# Patient Record
Sex: Female | Born: 1935 | Race: White | Hispanic: No
Health system: Southern US, Community
[De-identification: ages and names within clinical notes are randomized; demographics above are authoritative.]

---

## 2018-06-04 ENCOUNTER — Institutional Professional Consult (permissible substitution) (HOSPITAL_COMMUNITY): Payer: Medicare Other

## 2018-06-04 ENCOUNTER — Inpatient Hospital Stay
Admission: AD | Admit: 2018-06-04 | Discharge: 2018-07-07 | Disposition: A | Discharge status: Home Health | Payer: Medicare Other | Source: Other Acute Inpatient Hospital | Attending: Internal Medicine | Admitting: Internal Medicine

## 2018-06-04 ENCOUNTER — Inpatient Hospital Stay
Admission: AD | Admit: 2018-06-04 | Payer: Self-pay | Source: Other Acute Inpatient Hospital | Admitting: Internal Medicine

## 2018-06-04 DIAGNOSIS — J9690 Respiratory failure, unspecified, unspecified whether with hypoxia or hypercapnia: Secondary | ICD-10-CM

## 2018-06-04 DIAGNOSIS — J189 Pneumonia, unspecified organism: Secondary | ICD-10-CM

## 2018-06-04 DIAGNOSIS — K651 Peritoneal abscess: Secondary | ICD-10-CM

## 2018-06-04 DIAGNOSIS — K6819 Other retroperitoneal abscess: Secondary | ICD-10-CM

## 2018-06-04 DIAGNOSIS — R112 Nausea with vomiting, unspecified: Secondary | ICD-10-CM

## 2018-06-04 MED ORDER — LORAZEPAM 0.5 MG PO TABS
.50 | ORAL_TABLET | ORAL | Status: DC
Start: ? — End: 2018-06-04

## 2018-06-04 MED ORDER — CHOLECALCIFEROL 25 MCG (1000 UT) PO TABS
2000.00 | ORAL_TABLET | ORAL | Status: DC
Start: 2018-06-05 — End: 2018-06-04

## 2018-06-04 MED ORDER — FLECAINIDE ACETATE 50 MG PO TABS
50.00 | ORAL_TABLET | ORAL | Status: DC
Start: 2018-06-04 — End: 2018-06-04

## 2018-06-04 MED ORDER — MAGNESIUM OXIDE 400 MG PO TABS
400.00 | ORAL_TABLET | ORAL | Status: DC
Start: 2018-06-04 — End: 2018-06-04

## 2018-06-04 MED ORDER — GENERIC EXTERNAL MEDICATION
100.00 | Status: DC
Start: 2018-06-04 — End: 2018-06-04

## 2018-06-04 MED ORDER — LEVOTHYROXINE SODIUM 75 MCG PO TABS
75.00 | ORAL_TABLET | ORAL | Status: DC
Start: 2018-06-05 — End: 2018-06-04

## 2018-06-04 MED ORDER — OCTREOTIDE ACETATE 100 MCG/ML IJ SOLN
50.00 | INTRAMUSCULAR | Status: DC
Start: 2018-06-04 — End: 2018-06-04

## 2018-06-04 MED ORDER — DILTIAZEM HCL ER COATED BEADS 120 MG PO CP24
240.00 | ORAL_CAPSULE | ORAL | Status: DC
Start: 2018-06-05 — End: 2018-06-04

## 2018-06-04 MED ORDER — FLUTICASONE PROPIONATE 50 MCG/ACT NA SUSP
2.00 | NASAL | Status: DC
Start: 2018-06-04 — End: 2018-06-04

## 2018-06-04 MED ORDER — LORAZEPAM 0.5 MG PO TABS
0.50 | ORAL_TABLET | ORAL | Status: DC
Start: ? — End: 2018-06-04

## 2018-06-04 MED ORDER — POTASSIUM CHLORIDE ER 10 MEQ PO CPCR
20.00 | ORAL_CAPSULE | ORAL | Status: DC
Start: 2018-06-05 — End: 2018-06-04

## 2018-06-04 MED ORDER — FUROSEMIDE 20 MG PO TABS
20.00 | ORAL_TABLET | ORAL | Status: DC
Start: 2018-06-05 — End: 2018-06-04

## 2018-06-04 MED ORDER — GENERIC EXTERNAL MEDICATION
Status: DC
Start: ? — End: 2018-06-04

## 2018-06-04 MED ORDER — LISINOPRIL 5 MG PO TABS
5.00 | ORAL_TABLET | ORAL | Status: DC
Start: 2018-06-05 — End: 2018-06-04

## 2018-06-04 MED ORDER — BENZONATATE 100 MG PO CAPS
100.00 | ORAL_CAPSULE | ORAL | Status: DC
Start: ? — End: 2018-06-04

## 2018-06-04 MED ORDER — ENOXAPARIN SODIUM 40 MG/0.4ML ~~LOC~~ SOLN
40.00 | SUBCUTANEOUS | Status: DC
Start: 2018-06-04 — End: 2018-06-04

## 2018-06-04 MED ORDER — GENERIC EXTERNAL MEDICATION
2.00 | Status: DC
Start: 2018-06-04 — End: 2018-06-04

## 2018-06-04 MED ORDER — ACETAMINOPHEN 325 MG PO TABS
650.00 | ORAL_TABLET | ORAL | Status: DC
Start: ? — End: 2018-06-04

## 2018-06-04 MED ORDER — PANTOPRAZOLE SODIUM 40 MG PO TBEC
40.00 | DELAYED_RELEASE_TABLET | ORAL | Status: DC
Start: 2018-06-05 — End: 2018-06-04

## 2018-06-04 MED ORDER — FERROUS SULFATE 325 (65 FE) MG PO TABS
325.00 | ORAL_TABLET | ORAL | Status: DC
Start: 2018-06-05 — End: 2018-06-04

## 2018-06-04 MED ORDER — GENERIC EXTERNAL MEDICATION
300.00 | Status: DC
Start: 2018-06-04 — End: 2018-06-04

## 2018-06-04 MED ORDER — GUAIFENESIN 100 MG/5ML PO SYRP
200.00 | ORAL_SOLUTION | ORAL | Status: DC
Start: ? — End: 2018-06-04

## 2018-06-04 MED ORDER — GENERIC EXTERNAL MEDICATION
23.00 | Status: DC
Start: 2018-06-05 — End: 2018-06-04

## 2018-06-04 MED ORDER — DULOXETINE HCL 30 MG PO CPEP
30.00 | ORAL_CAPSULE | ORAL | Status: DC
Start: 2018-06-05 — End: 2018-06-04

## 2018-06-04 MED ORDER — GENERIC EXTERNAL MEDICATION
5.00 | Status: DC
Start: ? — End: 2018-06-04

## 2018-06-04 MED ORDER — ALBUTEROL SULFATE (2.5 MG/3ML) 0.083% IN NEBU
2.50 | INHALATION_SOLUTION | RESPIRATORY_TRACT | Status: DC
Start: ? — End: 2018-06-04

## 2018-06-05 LAB — CBC WITH DIFFERENTIAL/PLATELET
Abs Immature Granulocytes: 0.1 10*3/uL (ref 0.0–0.1)
Basophils Absolute: 0.1 10*3/uL (ref 0.0–0.1)
Basophils Relative: 1 %
EOS ABS: 0.3 10*3/uL (ref 0.0–0.7)
Eosinophils Relative: 3 %
HEMATOCRIT: 32.8 % — AB (ref 36.0–46.0)
Hemoglobin: 9.9 g/dL — ABNORMAL LOW (ref 12.0–15.0)
IMMATURE GRANULOCYTES: 1 %
LYMPHS ABS: 2.7 10*3/uL (ref 0.7–4.0)
Lymphocytes Relative: 26 %
MCH: 28.4 pg (ref 26.0–34.0)
MCHC: 30.2 g/dL (ref 30.0–36.0)
MCV: 94.3 fL (ref 78.0–100.0)
MONO ABS: 1.2 10*3/uL — AB (ref 0.1–1.0)
MONOS PCT: 11 %
NEUTROS PCT: 58 %
Neutro Abs: 6.1 10*3/uL (ref 1.7–7.7)
Platelets: 486 10*3/uL — ABNORMAL HIGH (ref 150–400)
RBC: 3.48 MIL/uL — ABNORMAL LOW (ref 3.87–5.11)
RDW: 15.3 % (ref 11.5–15.5)
WBC: 10.6 10*3/uL — ABNORMAL HIGH (ref 4.0–10.5)

## 2018-06-05 LAB — URINALYSIS, ROUTINE W REFLEX MICROSCOPIC
BILIRUBIN URINE: NEGATIVE
Glucose, UA: NEGATIVE mg/dL
KETONES UR: NEGATIVE mg/dL
Leukocytes, UA: NEGATIVE
Nitrite: NEGATIVE
PH: 5 (ref 5.0–8.0)
Protein, ur: 30 mg/dL — AB
Specific Gravity, Urine: 1.01 (ref 1.005–1.030)

## 2018-06-05 LAB — COMPREHENSIVE METABOLIC PANEL
ALBUMIN: 2.3 g/dL — AB (ref 3.5–5.0)
ALK PHOS: 86 U/L (ref 38–126)
ALT: 11 U/L (ref 0–44)
ANION GAP: 10 (ref 5–15)
AST: 24 U/L (ref 15–41)
BILIRUBIN TOTAL: 1.1 mg/dL (ref 0.3–1.2)
BUN: 51 mg/dL — ABNORMAL HIGH (ref 8–23)
CALCIUM: 10.4 mg/dL — AB (ref 8.9–10.3)
CO2: 25 mmol/L (ref 22–32)
Chloride: 100 mmol/L (ref 98–111)
Creatinine, Ser: 1.33 mg/dL — ABNORMAL HIGH (ref 0.44–1.00)
GFR calc Af Amer: 41 mL/min — ABNORMAL LOW (ref >60)
GFR, EST NON AFRICAN AMERICAN: 35 mL/min — AB (ref >60)
GLUCOSE: 137 mg/dL — AB (ref 70–99)
POTASSIUM: 4.8 mmol/L (ref 3.5–5.1)
Sodium: 135 mmol/L (ref 135–145)
TOTAL PROTEIN: 7 g/dL (ref 6.5–8.1)

## 2018-06-05 LAB — PROTIME-INR
INR: 1.14
Prothrombin Time: 14.5 seconds (ref 11.4–15.2)

## 2018-06-05 LAB — HEMOGLOBIN A1C
Hgb A1c MFr Bld: 6.1 % — ABNORMAL HIGH (ref 4.8–5.6)
MEAN PLASMA GLUCOSE: 128.37 mg/dL

## 2018-06-05 LAB — TSH: TSH: 1.889 u[IU]/mL (ref 0.350–4.500)

## 2018-06-05 LAB — PHOSPHORUS: Phosphorus: 5.6 mg/dL — ABNORMAL HIGH (ref 2.5–4.6)

## 2018-06-05 LAB — MAGNESIUM: MAGNESIUM: 2.3 mg/dL (ref 1.7–2.4)

## 2018-06-07 LAB — C DIFFICILE QUICK SCREEN W PCR REFLEX
C DIFFICILE (CDIFF) INTERP: NOT DETECTED
C DIFFICLE (CDIFF) ANTIGEN: NEGATIVE
C Diff toxin: NEGATIVE

## 2018-06-08 ENCOUNTER — Other Ambulatory Visit (HOSPITAL_COMMUNITY): Payer: Medicare Other

## 2018-06-09 LAB — CULTURE, BLOOD (ROUTINE X 2)
Culture: NO GROWTH
Culture: NO GROWTH
SPECIAL REQUESTS: ADEQUATE
SPECIAL REQUESTS: ADEQUATE

## 2018-06-10 LAB — CBC
HEMATOCRIT: 31.4 % — AB (ref 36.0–46.0)
HEMOGLOBIN: 9.7 g/dL — AB (ref 12.0–15.0)
MCH: 28.5 pg (ref 26.0–34.0)
MCHC: 30.9 g/dL (ref 30.0–36.0)
MCV: 92.4 fL (ref 78.0–100.0)
Platelets: 435 10*3/uL — ABNORMAL HIGH (ref 150–400)
RBC: 3.4 MIL/uL — AB (ref 3.87–5.11)
RDW: 15 % (ref 11.5–15.5)
WBC: 9.1 10*3/uL (ref 4.0–10.5)

## 2018-06-10 LAB — BASIC METABOLIC PANEL
ANION GAP: 8 (ref 5–15)
BUN: 47 mg/dL — ABNORMAL HIGH (ref 8–23)
CHLORIDE: 98 mmol/L (ref 98–111)
CO2: 24 mmol/L (ref 22–32)
Calcium: 10.4 mg/dL — ABNORMAL HIGH (ref 8.9–10.3)
Creatinine, Ser: 1.3 mg/dL — ABNORMAL HIGH (ref 0.44–1.00)
GFR calc non Af Amer: 36 mL/min — ABNORMAL LOW (ref >60)
GFR, EST AFRICAN AMERICAN: 42 mL/min — AB (ref >60)
Glucose, Bld: 159 mg/dL — ABNORMAL HIGH (ref 70–99)
POTASSIUM: 4.9 mmol/L (ref 3.5–5.1)
SODIUM: 130 mmol/L — AB (ref 135–145)

## 2018-06-13 LAB — TSH: TSH: 3.056 u[IU]/mL (ref 0.350–4.500)

## 2018-06-14 LAB — BASIC METABOLIC PANEL
Anion gap: 3 — ABNORMAL LOW (ref 5–15)
BUN: 52 mg/dL — ABNORMAL HIGH (ref 8–23)
CALCIUM: 10.2 mg/dL (ref 8.9–10.3)
CO2: 25 mmol/L (ref 22–32)
CREATININE: 1.31 mg/dL — AB (ref 0.44–1.00)
Chloride: 99 mmol/L (ref 98–111)
GFR, EST AFRICAN AMERICAN: 41 mL/min — AB (ref >60)
GFR, EST NON AFRICAN AMERICAN: 36 mL/min — AB (ref >60)
GLUCOSE: 152 mg/dL — AB (ref 70–99)
Potassium: 5.9 mmol/L — ABNORMAL HIGH (ref 3.5–5.1)
Sodium: 127 mmol/L — ABNORMAL LOW (ref 135–145)

## 2018-06-14 LAB — POTASSIUM: Potassium: 5.1 mmol/L (ref 3.5–5.1)

## 2018-06-15 LAB — BASIC METABOLIC PANEL
Anion gap: 7 (ref 5–15)
BUN: 46 mg/dL — AB (ref 8–23)
CO2: 26 mmol/L (ref 22–32)
CREATININE: 1.41 mg/dL — AB (ref 0.44–1.00)
Calcium: 10.5 mg/dL — ABNORMAL HIGH (ref 8.9–10.3)
Chloride: 99 mmol/L (ref 98–111)
GFR calc Af Amer: 38 mL/min — ABNORMAL LOW (ref >60)
GFR calc non Af Amer: 33 mL/min — ABNORMAL LOW (ref >60)
GLUCOSE: 123 mg/dL — AB (ref 70–99)
Potassium: 6.2 mmol/L — ABNORMAL HIGH (ref 3.5–5.1)
Sodium: 132 mmol/L — ABNORMAL LOW (ref 135–145)

## 2018-06-15 LAB — POTASSIUM: Potassium: 4.9 mmol/L (ref 3.5–5.1)

## 2018-06-16 ENCOUNTER — Other Ambulatory Visit (HOSPITAL_COMMUNITY): Payer: Medicare Other

## 2018-06-16 LAB — BASIC METABOLIC PANEL
ANION GAP: 7 (ref 5–15)
BUN: 35 mg/dL — ABNORMAL HIGH (ref 8–23)
CO2: 29 mmol/L (ref 22–32)
CREATININE: 1.32 mg/dL — AB (ref 0.44–1.00)
Calcium: 9.8 mg/dL (ref 8.9–10.3)
Chloride: 97 mmol/L — ABNORMAL LOW (ref 98–111)
GFR, EST AFRICAN AMERICAN: 41 mL/min — AB (ref >60)
GFR, EST NON AFRICAN AMERICAN: 35 mL/min — AB (ref >60)
Glucose, Bld: 116 mg/dL — ABNORMAL HIGH (ref 70–99)
Potassium: 4.3 mmol/L (ref 3.5–5.1)
SODIUM: 133 mmol/L — AB (ref 135–145)

## 2018-06-16 MED ORDER — IOHEXOL 300 MG/ML  SOLN
80.0000 mL | Freq: Once | INTRAMUSCULAR | Status: AC | PRN
  Administered 2018-06-16: 100 mL via INTRAVENOUS

## 2018-06-18 LAB — BASIC METABOLIC PANEL
Anion gap: 10 (ref 5–15)
BUN: 18 mg/dL (ref 8–23)
CHLORIDE: 95 mmol/L — AB (ref 98–111)
CO2: 26 mmol/L (ref 22–32)
Calcium: 9.1 mg/dL (ref 8.9–10.3)
Creatinine, Ser: 1.18 mg/dL — ABNORMAL HIGH (ref 0.44–1.00)
GFR calc Af Amer: 47 mL/min — ABNORMAL LOW (ref >60)
GFR calc non Af Amer: 41 mL/min — ABNORMAL LOW (ref >60)
GLUCOSE: 110 mg/dL — AB (ref 70–99)
POTASSIUM: 4.8 mmol/L (ref 3.5–5.1)
Sodium: 131 mmol/L — ABNORMAL LOW (ref 135–145)

## 2018-06-20 ENCOUNTER — Telehealth: Payer: Self-pay | Admitting: Physician Assistant

## 2018-06-20 NOTE — Telephone Encounter (Signed)
Spoke with patient's daughter/HCPOA Nikea Settle via phone [phone]) at her request. Ms. Colberg was concerned regarding request for drain injection stating that she was not made aware of this request and did not consent for this to be done. She states she was told by [facility] staff that a provider came into her mother's room and attempted to gain consent from her for a drain injection which caused her mother to be very upset since yesterday afternoon. She states that no one is to touch her mother without conversing with her first and all lab/study results should be discussed with her immediately. Additionally she is very upset that anything regarding her mother's healthcare was discussed with her in any capacity due to her dementia - she requests that no mention of any future plans be made to her mother as these are upsetting to her. She requests all information be relayed to [facility] only.  I discussed with Ms. Amstutz that I did see patient yesterday to evaluate transgluteal drain however I did NOT attempt to gain consent from patient or any other person on the unit as drain injections/removals for drains placed by our team do not require consent per our policy. I also explained that during the drain evaluation at bedside, which consisted of viewing the gravity bag and drain insertion site only, her mother repeatedly asked what I was doing and why to which I stated to the patient "I am here to look at your drain and see if we can possibly take it out later." Nothing else was discussed with patient and again, consent was not attempted to be obtained. I did speak with sitter in room briefly stating that a drain injection was requested by the patient's primary care team and that we would be bringing her to IR for this injection some time this week to see if the drain could possible be removed to which the sitter nodded her head and did not ask any further questions or express any concerns. I apologized several  times to Ms. Evangaline Jou for upsetting her mother as this was not my intention. I again discussed that drain injections are not usually something we need consent for however I was happy to discuss the injection with her today which we did - she indicated that all questions were answered to her satisfaction although she remains upset overall.   Ms. Cotrell does give permission for the drain injection to proceed however she would like someone to call when the injection is to occur and to discuss drain removal (if that is indicated after injection) prior to it being removed. She also requests that the results of the drain injection be relayed to her immediately, preferably by the radiologist or radiology PA, as she expresses difficult communication with her mother's primary care team as well as floor RNs.   I have relayed all of this information to the patient's primary RN as well as the IR team.   Plan to proceed with drain injection to evaluate fistula/abscess possibly tomorrow or the following day which was discussed with patient's daughter.    Lynnette Caffey, PA-C 06/20/18 11:16

## 2018-06-21 ENCOUNTER — Other Ambulatory Visit (HOSPITAL_COMMUNITY): Payer: Medicare Other

## 2018-06-21 ENCOUNTER — Encounter (HOSPITAL_COMMUNITY): Payer: Self-pay | Admitting: Interventional Radiology

## 2018-06-21 HISTORY — PX: IR SINUS/FIST TUBE CHK-NON GI: IMG673

## 2018-06-21 MED ORDER — IOPAMIDOL (ISOVUE-300) INJECTION 61%
INTRAVENOUS | Status: AC
  Filled 2018-06-21: qty 50

## 2018-06-21 MED ORDER — LIDOCAINE HCL 1 % IJ SOLN
INTRAMUSCULAR | Status: AC
  Filled 2018-06-21: qty 20

## 2018-06-21 MED ORDER — IOPAMIDOL (ISOVUE-300) INJECTION 61%
INTRAVENOUS | Status: AC
  Administered 2018-06-21: 10 mL
  Filled 2018-06-21: qty 50

## 2018-06-21 NOTE — Progress Notes (Signed)
Spoke with Mrs. Espy daughter to inform her that we would be bringing her mother down to IR for drain check and possible removal this morning. Daughter states it is ok to inject drain and remove if indicated. Daughter requests to be called when procedure done with results.

## 2018-06-21 NOTE — Procedures (Signed)
Pelvic abscess drain injection  Findings - Positive for fistula to small bowel  Plan - Continue drain. Follow up injection 2-4 weeks.  EBL 0 Comp 0

## 2018-06-22 ENCOUNTER — Other Ambulatory Visit (HOSPITAL_COMMUNITY): Payer: Medicare Other

## 2018-06-22 LAB — BASIC METABOLIC PANEL
Anion gap: 9 (ref 5–15)
BUN: 26 mg/dL — AB (ref 8–23)
CALCIUM: 9.1 mg/dL (ref 8.9–10.3)
CO2: 27 mmol/L (ref 22–32)
Chloride: 96 mmol/L — ABNORMAL LOW (ref 98–111)
Creatinine, Ser: 1.8 mg/dL — ABNORMAL HIGH (ref 0.44–1.00)
GFR calc Af Amer: 28 mL/min — ABNORMAL LOW (ref >60)
GFR, EST NON AFRICAN AMERICAN: 24 mL/min — AB (ref >60)
Glucose, Bld: 139 mg/dL — ABNORMAL HIGH (ref 70–99)
POTASSIUM: 3.9 mmol/L (ref 3.5–5.1)
SODIUM: 132 mmol/L — AB (ref 135–145)

## 2018-06-22 LAB — URINALYSIS, ROUTINE W REFLEX MICROSCOPIC
Bacteria, UA: NONE SEEN
Bilirubin Urine: NEGATIVE
Glucose, UA: NEGATIVE mg/dL
Ketones, ur: NEGATIVE mg/dL
Nitrite: NEGATIVE
PH: 6 (ref 5.0–8.0)
Protein, ur: >=300 mg/dL — AB
Specific Gravity, Urine: 1.013 (ref 1.005–1.030)

## 2018-06-23 LAB — BASIC METABOLIC PANEL
Anion gap: 7 (ref 5–15)
BUN: 35 mg/dL — ABNORMAL HIGH (ref 8–23)
CHLORIDE: 96 mmol/L — AB (ref 98–111)
CO2: 29 mmol/L (ref 22–32)
CREATININE: 1.41 mg/dL — AB (ref 0.44–1.00)
Calcium: 8.9 mg/dL (ref 8.9–10.3)
GFR calc Af Amer: 38 mL/min — ABNORMAL LOW (ref >60)
GFR calc non Af Amer: 33 mL/min — ABNORMAL LOW (ref >60)
Glucose, Bld: 142 mg/dL — ABNORMAL HIGH (ref 70–99)
Potassium: 4.1 mmol/L (ref 3.5–5.1)
Sodium: 132 mmol/L — ABNORMAL LOW (ref 135–145)

## 2018-06-23 LAB — CBC
HEMATOCRIT: 29 % — AB (ref 36.0–46.0)
Hemoglobin: 9 g/dL — ABNORMAL LOW (ref 12.0–15.0)
MCH: 28.9 pg (ref 26.0–34.0)
MCHC: 31 g/dL (ref 30.0–36.0)
MCV: 93.2 fL (ref 80.0–100.0)
Platelets: 257 10*3/uL (ref 150–400)
RBC: 3.11 MIL/uL — ABNORMAL LOW (ref 3.87–5.11)
RDW: 16 % — AB (ref 11.5–15.5)
WBC: 11.4 10*3/uL — ABNORMAL HIGH (ref 4.0–10.5)
nRBC: 0 % (ref 0.0–0.2)

## 2018-06-23 LAB — URINE CULTURE: Culture: NO GROWTH

## 2018-06-24 ENCOUNTER — Other Ambulatory Visit (HOSPITAL_COMMUNITY): Payer: Medicare Other

## 2018-06-24 LAB — BASIC METABOLIC PANEL
Anion gap: 6 (ref 5–15)
BUN: 39 mg/dL — ABNORMAL HIGH (ref 8–23)
CO2: 27 mmol/L (ref 22–32)
CREATININE: 1.26 mg/dL — AB (ref 0.44–1.00)
Calcium: 8.9 mg/dL (ref 8.9–10.3)
Chloride: 96 mmol/L — ABNORMAL LOW (ref 98–111)
GFR calc Af Amer: 43 mL/min — ABNORMAL LOW (ref >60)
GFR, EST NON AFRICAN AMERICAN: 37 mL/min — AB (ref >60)
Glucose, Bld: 146 mg/dL — ABNORMAL HIGH (ref 70–99)
Potassium: 4.5 mmol/L (ref 3.5–5.1)
SODIUM: 129 mmol/L — AB (ref 135–145)

## 2018-06-24 LAB — AMMONIA: AMMONIA: 32 umol/L (ref 9–35)

## 2018-06-25 LAB — BASIC METABOLIC PANEL
ANION GAP: 7 (ref 5–15)
BUN: 35 mg/dL — AB (ref 8–23)
CO2: 24 mmol/L (ref 22–32)
Calcium: 8.6 mg/dL — ABNORMAL LOW (ref 8.9–10.3)
Chloride: 98 mmol/L (ref 98–111)
Creatinine, Ser: 1.09 mg/dL — ABNORMAL HIGH (ref 0.44–1.00)
GFR calc Af Amer: 52 mL/min — ABNORMAL LOW (ref >60)
GFR calc non Af Amer: 45 mL/min — ABNORMAL LOW (ref >60)
GLUCOSE: 135 mg/dL — AB (ref 70–99)
POTASSIUM: 4.3 mmol/L (ref 3.5–5.1)
Sodium: 129 mmol/L — ABNORMAL LOW (ref 135–145)

## 2018-06-26 LAB — BASIC METABOLIC PANEL
ANION GAP: 8 (ref 5–15)
BUN: 37 mg/dL — ABNORMAL HIGH (ref 8–23)
CALCIUM: 8.6 mg/dL — AB (ref 8.9–10.3)
CO2: 25 mmol/L (ref 22–32)
CREATININE: 1.12 mg/dL — AB (ref 0.44–1.00)
Chloride: 97 mmol/L — ABNORMAL LOW (ref 98–111)
GFR calc Af Amer: 50 mL/min — ABNORMAL LOW (ref >60)
GFR calc non Af Amer: 43 mL/min — ABNORMAL LOW (ref >60)
GLUCOSE: 147 mg/dL — AB (ref 70–99)
Potassium: 4.7 mmol/L (ref 3.5–5.1)
Sodium: 130 mmol/L — ABNORMAL LOW (ref 135–145)

## 2018-06-27 ENCOUNTER — Other Ambulatory Visit (HOSPITAL_COMMUNITY): Payer: Medicare Other

## 2018-06-27 LAB — CBC WITH DIFFERENTIAL/PLATELET
Abs Immature Granulocytes: 0.17 10*3/uL — ABNORMAL HIGH (ref 0.00–0.07)
BASOS PCT: 0 %
Basophils Absolute: 0 10*3/uL (ref 0.0–0.1)
EOS ABS: 0.1 10*3/uL (ref 0.0–0.5)
Eosinophils Relative: 1 %
HCT: 26.1 % — ABNORMAL LOW (ref 36.0–46.0)
Hemoglobin: 8 g/dL — ABNORMAL LOW (ref 12.0–15.0)
IMMATURE GRANULOCYTES: 1 %
Lymphocytes Relative: 4 %
Lymphs Abs: 0.8 10*3/uL (ref 0.7–4.0)
MCH: 27.9 pg (ref 26.0–34.0)
MCHC: 30.7 g/dL (ref 30.0–36.0)
MCV: 90.9 fL (ref 80.0–100.0)
MONO ABS: 1.8 10*3/uL — AB (ref 0.1–1.0)
Monocytes Relative: 8 %
NEUTROS PCT: 86 %
Neutro Abs: 18.5 10*3/uL — ABNORMAL HIGH (ref 1.7–7.7)
PLATELETS: 287 10*3/uL (ref 150–400)
RBC: 2.87 MIL/uL — ABNORMAL LOW (ref 3.87–5.11)
RDW: 15.5 % (ref 11.5–15.5)
WBC: 21.4 10*3/uL — ABNORMAL HIGH (ref 4.0–10.5)
nRBC: 0 % (ref 0.0–0.2)

## 2018-06-27 LAB — CULTURE, BLOOD (ROUTINE X 2)
CULTURE: NO GROWTH
Culture: NO GROWTH
SPECIAL REQUESTS: ADEQUATE
SPECIAL REQUESTS: ADEQUATE

## 2018-06-28 LAB — BASIC METABOLIC PANEL
ANION GAP: 9 (ref 5–15)
BUN: 46 mg/dL — ABNORMAL HIGH (ref 8–23)
CHLORIDE: 96 mmol/L — AB (ref 98–111)
CO2: 23 mmol/L (ref 22–32)
Calcium: 8.7 mg/dL — ABNORMAL LOW (ref 8.9–10.3)
Creatinine, Ser: 1.3 mg/dL — ABNORMAL HIGH (ref 0.44–1.00)
GFR calc Af Amer: 42 mL/min — ABNORMAL LOW (ref >60)
GFR calc non Af Amer: 36 mL/min — ABNORMAL LOW (ref >60)
Glucose, Bld: 140 mg/dL — ABNORMAL HIGH (ref 70–99)
POTASSIUM: 5.4 mmol/L — AB (ref 3.5–5.1)
Sodium: 128 mmol/L — ABNORMAL LOW (ref 135–145)

## 2018-06-28 LAB — EXPECTORATED SPUTUM ASSESSMENT W GRAM STAIN, RFLX TO RESP C

## 2018-06-29 ENCOUNTER — Other Ambulatory Visit (HOSPITAL_COMMUNITY): Payer: Medicare Other

## 2018-06-29 LAB — CBC
HCT: 24.6 % — ABNORMAL LOW (ref 36.0–46.0)
HEMOGLOBIN: 7.6 g/dL — AB (ref 12.0–15.0)
MCH: 27.8 pg (ref 26.0–34.0)
MCHC: 30.9 g/dL (ref 30.0–36.0)
MCV: 90.1 fL (ref 80.0–100.0)
NRBC: 0 % (ref 0.0–0.2)
PLATELETS: 290 10*3/uL (ref 150–400)
RBC: 2.73 MIL/uL — AB (ref 3.87–5.11)
RDW: 15.9 % — ABNORMAL HIGH (ref 11.5–15.5)
WBC: 23 10*3/uL — AB (ref 4.0–10.5)

## 2018-06-29 LAB — BASIC METABOLIC PANEL
ANION GAP: 9 (ref 5–15)
BUN: 44 mg/dL — AB (ref 8–23)
CHLORIDE: 99 mmol/L (ref 98–111)
CO2: 21 mmol/L — ABNORMAL LOW (ref 22–32)
Calcium: 8.4 mg/dL — ABNORMAL LOW (ref 8.9–10.3)
Creatinine, Ser: 1.22 mg/dL — ABNORMAL HIGH (ref 0.44–1.00)
GFR calc non Af Amer: 39 mL/min — ABNORMAL LOW (ref >60)
GFR, EST AFRICAN AMERICAN: 45 mL/min — AB (ref >60)
Glucose, Bld: 174 mg/dL — ABNORMAL HIGH (ref 70–99)
POTASSIUM: 4.9 mmol/L (ref 3.5–5.1)
SODIUM: 129 mmol/L — AB (ref 135–145)

## 2018-06-29 NOTE — Progress Notes (Signed)
    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 Grace Isaac.  Recent injection shows persistent fistula.  Subjective:  Patient resting comfortably. Daughter at bedside.  Allergies: Patient has no allergy information on record.  Medications: Prior to Admission medications   Not on File    Physical Exam Resting  Drain in place Drainage has some blood ting.  Imaging: Dg Chest Port 1 View  Result Date: 06/29/2018 CLINICAL DATA:  Respiratory failure EXAM: PORTABLE CHEST 1 VIEW COMPARISON:  06/27/2018 FINDINGS: Right PICC line remains in place, unchanged. Cardiomegaly. Vascular congestion. Focal left lower lobe airspace opacity has worsened. Probable left effusion. Findings concerning for pneumonia. Partially visualized left humeral shaft fracture again noted, unchanged. IMPRESSION: Cardiomegaly with vascular congestion. Worsening left lower lobe airspace opacity concerning for pneumonia. Probable small left effusion. Electronically Signed   By: Charlett Nose M.D.   On: 06/29/2018 12:17   Dg Chest Port 1 View  Result Date: 06/27/2018 CLINICAL DATA:  Pneumonia Right picc present EXAM: PORTABLE CHEST - 1 VIEW COMPARISON:  06/22/2018 FINDINGS: Some increase in patchy airspace opacities in the left mid and lower lung. Right perihilar interstitial opacities as before. Heart size upper limits normal for technique. Aortic Atherosclerosis (ICD10-170.0). No effusion.  Right arm PICC to the distal SVC stable. Fracture deformities of the right humeral head and proximal left humerus. IMPRESSION: Worsening left lower lung airspace disease suggesting pneumonia. Electronically Signed   By: Corlis Leak M.D.   On: 06/27/2018 10:32    Labs:  CBC: Recent Labs    06/10/18 0630 06/23/18 0647  06/27/18 1345 06/29/18 0720  WBC 9.1 11.4* 21.4* 23.0*  HGB 9.7* 9.0* 8.0* 7.6*  HCT 31.4* 29.0* 26.1* 24.6*  PLT 435* 257 287 290    COAGS: Recent Labs    06/05/18 0653  INR 1.14    BMP: Recent Labs    06/25/18 0514 06/26/18 0552 06/28/18 0715 06/29/18 0720  NA 129* 130* 128* 129*  K 4.3 4.7 5.4* 4.9  CL 98 97* 96* 99  CO2 24 25 23  21*  GLUCOSE 135* 147* 140* 174*  BUN 35* 37* 46* 44*  CALCIUM 8.6* 8.6* 8.7* 8.4*  CREATININE 1.09* 1.12* 1.30* 1.22*  GFRNONAA 45* 43* 36* 39*  GFRAA 52* 50* 42* 45*    LIVER FUNCTION TESTS: Recent Labs    06/05/18 0653  BILITOT 1.1  AST 24  ALT 11  ALKPHOS 86  PROT 7.0  ALBUMIN 2.3*    Assessment and Plan:  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 Grace Isaac.  Fistulous connection remaining on recent CT scan.  Nurses are NOT flushing as directed.  Will consider repeating drain injection next week.  Electronically Signed: Gwynneth Macleod, PA-C 06/29/2018, 2:13 PM    I spent a total of 15 Minutes at the the patient's bedside AND on the patient's hospital floor or unit, greater than 50% of which was counseling/coordinating care for

## 2018-06-30 LAB — BASIC METABOLIC PANEL
ANION GAP: 12 (ref 5–15)
BUN: 47 mg/dL — ABNORMAL HIGH (ref 8–23)
CALCIUM: 8.9 mg/dL (ref 8.9–10.3)
CO2: 19 mmol/L — ABNORMAL LOW (ref 22–32)
Chloride: 98 mmol/L (ref 98–111)
Creatinine, Ser: 1.35 mg/dL — ABNORMAL HIGH (ref 0.44–1.00)
GFR, EST AFRICAN AMERICAN: 40 mL/min — AB (ref >60)
GFR, EST NON AFRICAN AMERICAN: 34 mL/min — AB (ref >60)
GLUCOSE: 182 mg/dL — AB (ref 70–99)
Potassium: 4.5 mmol/L (ref 3.5–5.1)
SODIUM: 129 mmol/L — AB (ref 135–145)

## 2018-07-02 LAB — BASIC METABOLIC PANEL
ANION GAP: 8 (ref 5–15)
BUN: 67 mg/dL — ABNORMAL HIGH (ref 8–23)
CALCIUM: 8.7 mg/dL — AB (ref 8.9–10.3)
CO2: 25 mmol/L (ref 22–32)
Chloride: 101 mmol/L (ref 98–111)
Creatinine, Ser: 1.29 mg/dL — ABNORMAL HIGH (ref 0.44–1.00)
GFR calc non Af Amer: 36 mL/min — ABNORMAL LOW (ref >60)
GFR, EST AFRICAN AMERICAN: 42 mL/min — AB (ref >60)
Glucose, Bld: 216 mg/dL — ABNORMAL HIGH (ref 70–99)
POTASSIUM: 3.7 mmol/L (ref 3.5–5.1)
Sodium: 134 mmol/L — ABNORMAL LOW (ref 135–145)

## 2018-07-03 LAB — VANCOMYCIN, TROUGH: Vancomycin Tr: 13 ug/mL — ABNORMAL LOW (ref 15–20)

## 2018-07-04 LAB — BASIC METABOLIC PANEL
Anion gap: 10 (ref 5–15)
BUN: 60 mg/dL — ABNORMAL HIGH (ref 8–23)
CHLORIDE: 100 mmol/L (ref 98–111)
CO2: 22 mmol/L (ref 22–32)
Calcium: 8.8 mg/dL — ABNORMAL LOW (ref 8.9–10.3)
Creatinine, Ser: 1.07 mg/dL — ABNORMAL HIGH (ref 0.44–1.00)
GFR calc Af Amer: 53 mL/min — ABNORMAL LOW (ref >60)
GFR calc non Af Amer: 46 mL/min — ABNORMAL LOW (ref >60)
Glucose, Bld: 200 mg/dL — ABNORMAL HIGH (ref 70–99)
POTASSIUM: 3.6 mmol/L (ref 3.5–5.1)
Sodium: 132 mmol/L — ABNORMAL LOW (ref 135–145)

## 2018-07-05 ENCOUNTER — Other Ambulatory Visit (HOSPITAL_COMMUNITY): Payer: Medicare Other

## 2018-07-05 DIAGNOSIS — I713 Abdominal aortic aneurysm, ruptured: Secondary | ICD-10-CM | POA: Diagnosis not present

## 2018-07-05 LAB — CBC
HCT: 27 % — ABNORMAL LOW (ref 36.0–46.0)
Hemoglobin: 8.6 g/dL — ABNORMAL LOW (ref 12.0–15.0)
MCH: 27.7 pg (ref 26.0–34.0)
MCHC: 31.9 g/dL (ref 30.0–36.0)
MCV: 86.8 fL (ref 80.0–100.0)
PLATELETS: 528 10*3/uL — AB (ref 150–400)
RBC: 3.11 MIL/uL — ABNORMAL LOW (ref 3.87–5.11)
RDW: 16 % — AB (ref 11.5–15.5)
WBC: 26.9 10*3/uL — AB (ref 4.0–10.5)
nRBC: 0 % (ref 0.0–0.2)

## 2018-07-05 MED ORDER — IOHEXOL 300 MG/ML  SOLN
100.0000 mL | Freq: Once | INTRAMUSCULAR | Status: AC
  Administered 2018-07-05: 100 mL via INTRAVENOUS

## 2018-07-05 NOTE — Consult Note (Signed)
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 female seen for discussion of new finding on CT scan earlier today.  Her daughter is present.  She is hard of hearing and is in and out of sleep but the daughter is nurse and the main discussion is with her.  Patient has an extremely complex history and [facility].  [facility] was [facility] to [facility] with sepsis.  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  She had both fungal and bacterial infection.  Had hydronephrosis related to this as well and had renal stenting.  [facility].  She had CT earlier today for follow-up.  This had marked changes regarding her most recent CT scan on 06/16/2018.  No past medical history on file.  No family history on file.  SOCIAL HISTORY: Social History   Tobacco Use  . Smoking status: Not on file  Substance Use Topics  . Alcohol use: Not on file    Allergies not on file  No current facility-administered medications for this encounter.     REVIEW OF SYSTEMS:  Reviewed in her history and physical  PHYSICAL EXAM: There were no vitals filed for this visit.  GENERAL: The patient is a well-nourished female, in no acute distress. The vital signs are documented above. CARDIOVASCULAR: Palpable radial pulses bilaterally PULMONARY: There is good air exchange  MUSCULOSKELETAL: There are no major deformities or cyanosis. NEUROLOGIC: No focal weakness or paresthesias are detected. SKIN: There are no ulcers or rashes noted. PSYCHIATRIC: The patient has a normal affect.  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  She has very small and very atherosclerotic aorta and iliac segments below this.  MEDICAL ISSUES: I discussed this at length with the patient and her daughter present.  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  I explained that there is essentially no treatment options for her.  She obviously would not tolerate open repair of her aorta even if there was an option for this.  Also explained that she is not a candidate for stent graft repair due to the presumed septic nature of this and also the involvement of the celiac artery.  In addition there would be impossible to deliver a stent graft through her diseased aortoiliac segments.  I explained that in all likelihood she will rupture aorta and I suspect that this will be in the relatively near future.  Fortunately she is comfortable with no pain associated with this.  I explained to the daughter that this in all likelihood to be quite rapid with minimal suffering.  I suggested involving hospice and palliative care.  The daughter reports that the mother is quite resistant to discussing this.  She is questioning me whether it would be acceptable for her to be discharged home if there is adequate help.  I explained that this would certainly be her decision but I do not see any contraindication to this and that I would recommend comfort measures only.  We will see her on an as-needed basis  Larina Earthlyodd F. Joey Hudock, MD FACS [facility] Office Tel [phone] Pager [phone]

## 2018-07-06 LAB — BASIC METABOLIC PANEL
ANION GAP: 8 (ref 5–15)
BUN: 67 mg/dL — ABNORMAL HIGH (ref 8–23)
CALCIUM: 8.4 mg/dL — AB (ref 8.9–10.3)
CHLORIDE: 100 mmol/L (ref 98–111)
CO2: 25 mmol/L (ref 22–32)
CREATININE: 1.24 mg/dL — AB (ref 0.44–1.00)
GFR calc Af Amer: 44 mL/min — ABNORMAL LOW (ref >60)
GFR calc non Af Amer: 38 mL/min — ABNORMAL LOW (ref >60)
Glucose, Bld: 226 mg/dL — ABNORMAL HIGH (ref 70–99)
Potassium: 3.3 mmol/L — ABNORMAL LOW (ref 3.5–5.1)
SODIUM: 133 mmol/L — AB (ref 135–145)

## 2018-07-06 LAB — CBC
HCT: 25.7 % — ABNORMAL LOW (ref 36.0–46.0)
Hemoglobin: 7.9 g/dL — ABNORMAL LOW (ref 12.0–15.0)
MCH: 27.3 pg (ref 26.0–34.0)
MCHC: 30.7 g/dL (ref 30.0–36.0)
MCV: 88.9 fL (ref 80.0–100.0)
NRBC: 0 % (ref 0.0–0.2)
PLATELETS: 519 10*3/uL — AB (ref 150–400)
RBC: 2.89 MIL/uL — AB (ref 3.87–5.11)
RDW: 16.1 % — AB (ref 11.5–15.5)
WBC: 19.8 10*3/uL — AB (ref 4.0–10.5)

## 2018-07-06 LAB — SEDIMENTATION RATE: Sed Rate: >140 mm/hr — ABNORMAL HIGH (ref 0–22)

## 2018-07-07 NOTE — Progress Notes (Signed)
Patient ID: Edison Nasuti, female   DOB: [birth year], 84 y.o.   MRN: [id number]   Pt came to [facility] with existing drain  - Rt TG drain  IR was asked to evaluate and manage drain 10/9 Inj:  IMPRESSION: Injection of the pelvic abscess drain demonstrates a persistent abscess cavity and a persistent fistula to small bowel loops.  CT 10/23:  IMPRESSION: 1. 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 This extraluminal collection of blood measures 2.8 x 1.3 x 2.8 cm. This is surrounded by localized lower attenuation fluid. Critical Value/emergent results were called by telephone at the time of interpretation on 07/05/2018 at 1:11 pm to Dr. Luna Kitchens , who verbally acknowledged these results. 2. Small bilateral pleural effusions associated with basilar atelectasis, most evident on the left, new since the prior CT. 3. The fluid in the presacral space and associated pigtail catheter without significant change from prior CT. No new abdominal collections. 4. Right-sided hydronephrosis and right ureteral stent are stable from the prior CT.  Have re written order for flush and record OP of drain  Will re CT pelvis next week

## 2018-07-14 DEATH — deceased

## 2020-02-13 IMAGING — CT CT ABD-PELV W/ CM
2 of 6 series · 13 of 46 positions shown, 15 images · IV contrast (APPLIED)
Comparison: 05/31/2018

CLINICAL DATA: GI fistula, presacral abscess

EXAM:
CT ABDOMEN AND PELVIS WITH CONTRAST
TECHNIQUE: Multidetector CT imaging of the abdomen and pelvis was performed
using the standard protocol following bolus administration of
intravenous contrast. Sagittal and coronal MPR images reconstructed
from axial data set.
CONTRAST:  100mL OMNIPAQUE IOHEXOL 300 MG/ML SOLN IV. No oral
contrast.

[Series 3: abd/ pelvis 5.0 i30f 2 · axial · 0.75mm/px · z∈[+1025,+1380]mm · 10 of 87 slices shown, 12 images]
[im 8/87  soft-tissue]
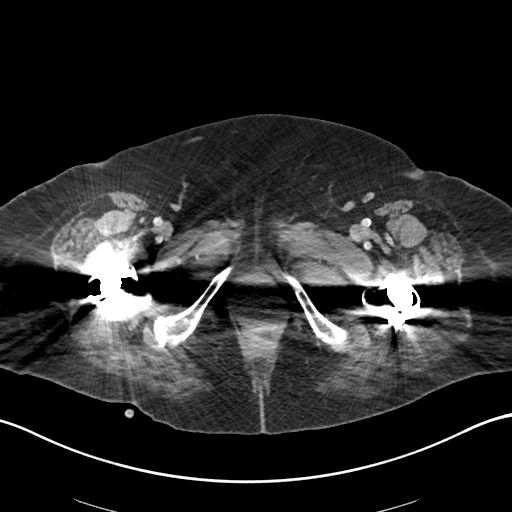
[im 8/87  bone]
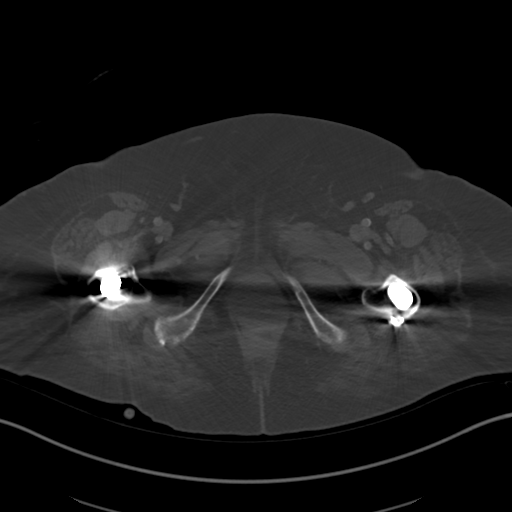
[im 16/87  soft-tissue]
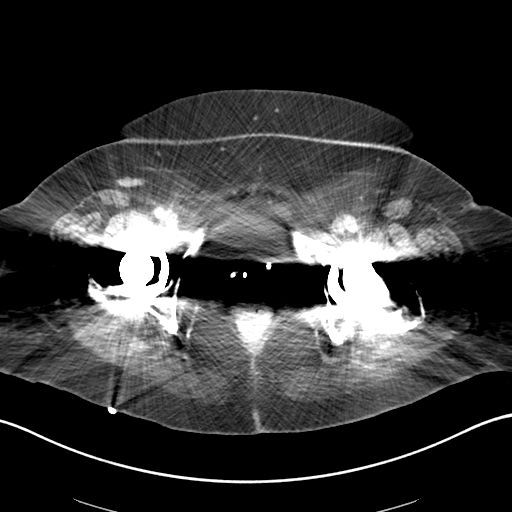
[im 24/87  soft-tissue]
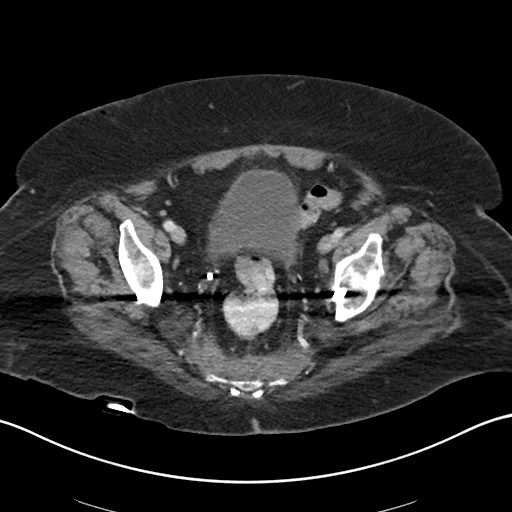
[im 32/87  soft-tissue]
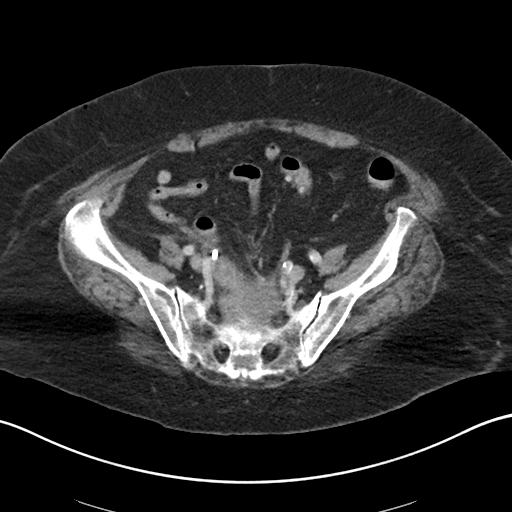
[im 40/87  soft-tissue]
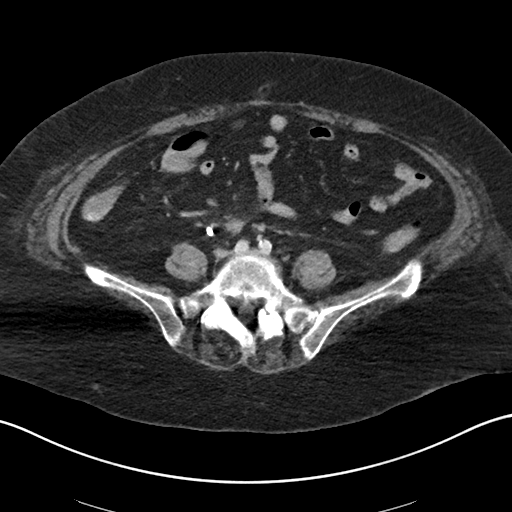
[im 47/87  soft-tissue]
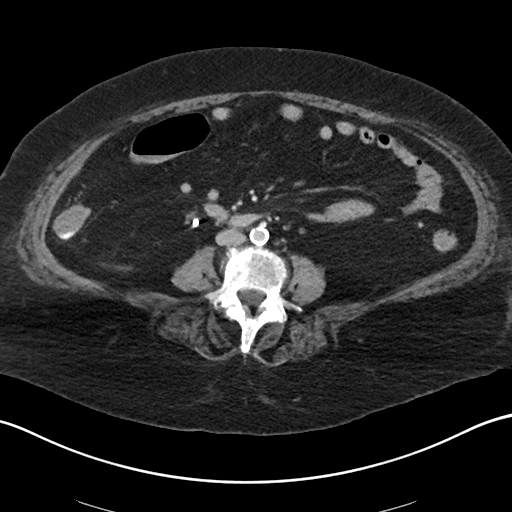
[im 55/87  soft-tissue]
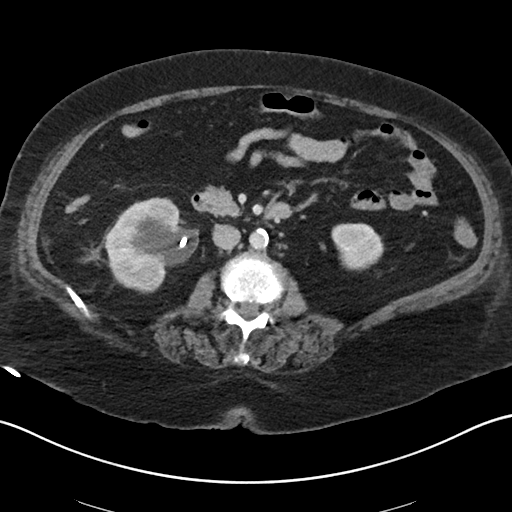
[im 63/87  soft-tissue]
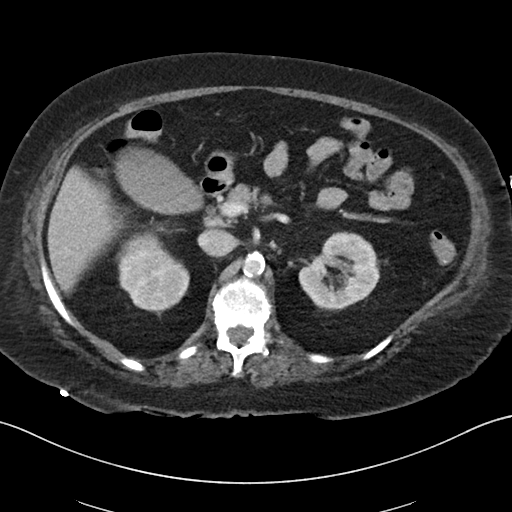
[im 71/87  soft-tissue]
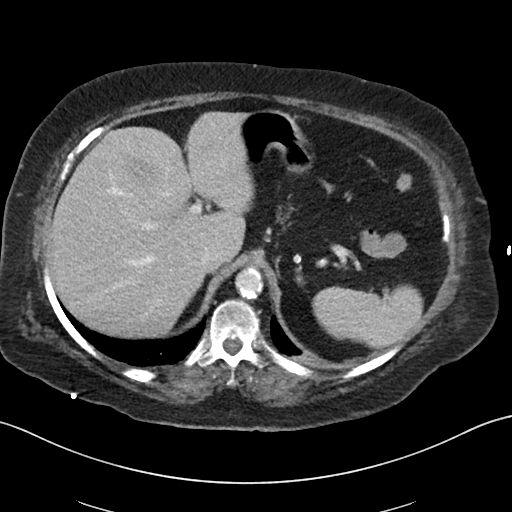
[im 71/87  bone]
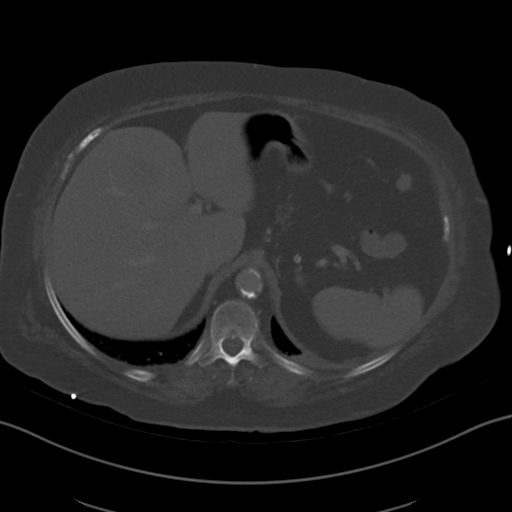
[im 79/87  soft-tissue]
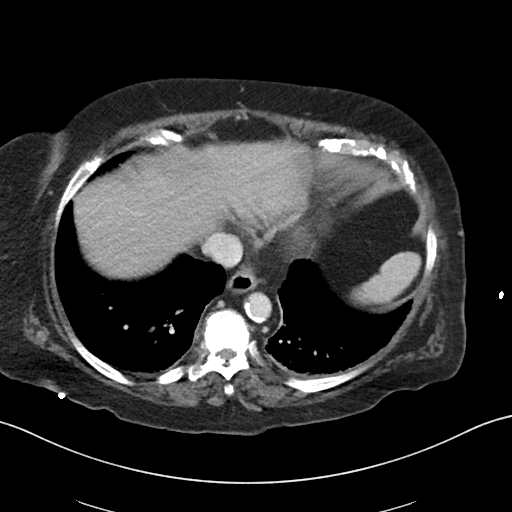

[Series 6: coronal soft tissue · coronal · 0.85mm/px · 3 of 101 slices shown]
[im 34/101  soft-tissue]
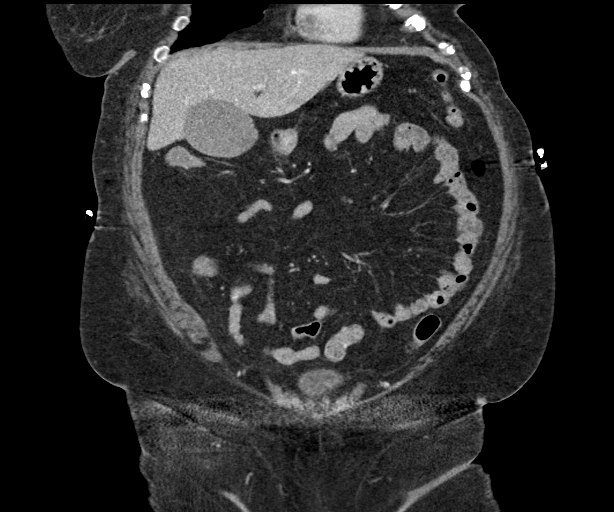
[im 45/101  soft-tissue]
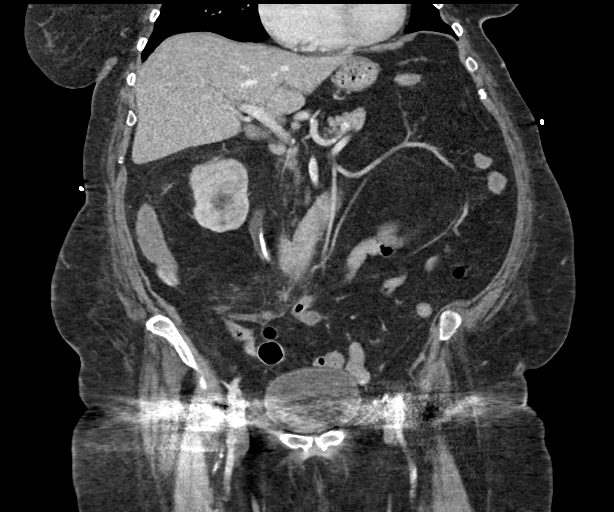
[im 56/101  soft-tissue]
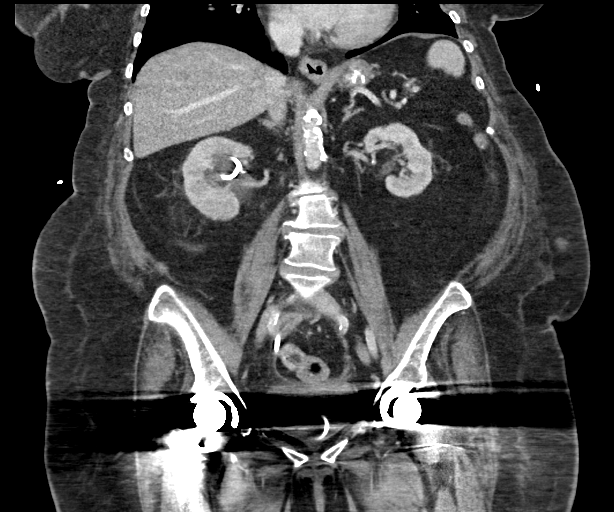

[13 of 46 positions shown; findings below may reference images not displayed]

FINDINGS: Lower chest: Minimal LEFT basilar atelectasis

Hepatobiliary: Distended gallbladder. No calcified gallstones. Liver
and gallbladder otherwise normal appearance.

Pancreas: Atrophic, no acute abnormalities

Spleen: Normal appearance.  Small splenule inferior to spleen

Adrenals/Urinary Tract: Adrenal glands normal appearance. LEFT
kidney normal appearance without mass or hydronephrosis.
Unremarkable LEFT ureter. Delay in RIGHT nephrogram with RIGHT
hydronephrosis and RIGHT ureteral stent extending the urinary
bladder. No RIGHT renal mass.

Stomach/Bowel: Small hiatal hernia. Stomach otherwise unremarkable.
Small bowel loops normal appearance. Small amount of residual
contrast within rectum. Normal appendix. Colon decompressed. No
bowel dilatation or bowel wall thickening. Minimal diverticulosis of
the sigmoid colon without evidence of acute diverticulitis.

Vascular/Lymphatic: Extensive atherosclerotic calcifications aorta
and abdominal branch vessels without aneurysm. This includes marked
atherosclerotic plaque formation and high-grade narrowing of the
renal artery origins and proximal renal arteries bilaterally, SMA,
and celiac arteries. Aorta normal caliber. No adenopathy.

Reproductive: Uterus surgically absent. Nonvisualization of the
ovaries.

Other: Extensive infiltrative changes of the presacral space.
Pigtail drainage catheter in presacral space is unchanged.
Infiltrative changes in the pelvis extend anteriorly and superiorly
medial to the course of the RIGHT ureter into the inferior mesentery
to a small bowel loop. Appearance is stable since the previous exam.
No new fluid collections, infiltrative change or soft tissue gas
identified.

Musculoskeletal: BILATERAL hip prostheses. Prior LEFT laminotomy L5.
Chronic compression deformities of the superior endplates of T12 and
L1.
IMPRESSION: Persistent infiltrative changes in the presacral space with again
identified pigtail drainage catheter.

The infiltrative changes extend cranially medial to the RIGHT ureter
similar to the previous exam, extending to a small bowel loop in the
mid abdomen.

No new collections are identified.

Persistent RIGHT hydronephrosis and delay in RIGHT nephrogram
despite ureteral stent.

Extensive atherosclerotic calcifications including the significant
plaque and narrowing at the origins of the celiac, superior
mesenteric and renal arteries.
